# Patient Record
Sex: Female | Born: 1965 | Marital: Married | Smoking: Never smoker
Health system: Southern US, Community
[De-identification: ages and names within clinical notes are randomized; demographics above are authoritative.]

## PROBLEM LIST (undated history)

## (undated) DIAGNOSIS — J45909 Unspecified asthma, uncomplicated: Secondary | ICD-10-CM

## (undated) HISTORY — DX: Unspecified asthma, uncomplicated: J45.909

---

## 2010-03-09 ENCOUNTER — Ambulatory Visit: Payer: Self-pay | Admitting: Family Medicine

## 2011-05-31 ENCOUNTER — Ambulatory Visit: Payer: Self-pay

## 2012-06-18 ENCOUNTER — Ambulatory Visit: Payer: Self-pay | Admitting: Obstetrics and Gynecology

## 2013-06-19 ENCOUNTER — Ambulatory Visit: Payer: Self-pay | Admitting: Obstetrics and Gynecology

## 2013-11-16 DIAGNOSIS — J45909 Unspecified asthma, uncomplicated: Secondary | ICD-10-CM | POA: Insufficient documentation

## 2013-11-25 ENCOUNTER — Ambulatory Visit: Payer: Self-pay | Admitting: Physician Assistant

## 2013-11-25 LAB — URINALYSIS, COMPLETE
Bacteria: NEGATIVE
Bilirubin,UR: NEGATIVE
GLUCOSE, UR: NEGATIVE mg/dL (ref 0–75)
Ketone: NEGATIVE
Leukocyte Esterase: NEGATIVE
NITRITE: NEGATIVE
Ph: 7.5 (ref 4.5–8.0)
Protein: NEGATIVE
Specific Gravity: 1.005 (ref 1.003–1.030)

## 2013-11-25 LAB — CBC WITH DIFFERENTIAL/PLATELET
BASOS ABS: 0.1 10*3/uL (ref 0.0–0.1)
Basophil %: 0.9 %
EOS PCT: 3.9 %
Eosinophil #: 0.3 10*3/uL (ref 0.0–0.7)
HCT: 34.5 % — ABNORMAL LOW (ref 35.0–47.0)
HGB: 10.9 g/dL — AB (ref 12.0–16.0)
Lymphocyte #: 1.4 10*3/uL (ref 1.0–3.6)
Lymphocyte %: 20.5 %
MCH: 24.9 pg — ABNORMAL LOW (ref 26.0–34.0)
MCHC: 31.8 g/dL — AB (ref 32.0–36.0)
MCV: 78 fL — ABNORMAL LOW (ref 80–100)
Monocyte #: 0.5 x10 3/mm (ref 0.2–0.9)
Monocyte %: 8 %
NEUTROS ABS: 4.5 10*3/uL (ref 1.4–6.5)
Neutrophil %: 66.7 %
PLATELETS: 291 10*3/uL (ref 150–440)
RBC: 4.39 10*6/uL (ref 3.80–5.20)
RDW: 14.2 % (ref 11.5–14.5)
WBC: 6.7 10*3/uL (ref 3.6–11.0)

## 2013-11-25 LAB — BASIC METABOLIC PANEL
ANION GAP: 8 (ref 7–16)
BUN: 7 mg/dL (ref 7–18)
CHLORIDE: 101 mmol/L (ref 98–107)
Calcium, Total: 8.3 mg/dL — ABNORMAL LOW (ref 8.5–10.1)
Co2: 30 mmol/L (ref 21–32)
Creatinine: 0.79 mg/dL (ref 0.60–1.30)
GLUCOSE: 103 mg/dL — AB (ref 65–99)
OSMOLALITY: 276 (ref 275–301)
POTASSIUM: 3.7 mmol/L (ref 3.5–5.1)
Sodium: 139 mmol/L (ref 136–145)

## 2014-06-24 ENCOUNTER — Ambulatory Visit: Payer: Self-pay | Admitting: Obstetrics and Gynecology

## 2015-06-28 ENCOUNTER — Other Ambulatory Visit: Payer: Self-pay | Admitting: Obstetrics and Gynecology

## 2015-06-28 DIAGNOSIS — Z1231 Encounter for screening mammogram for malignant neoplasm of breast: Secondary | ICD-10-CM

## 2015-06-28 LAB — HM PAP SMEAR: HM Pap smear: NEGATIVE

## 2015-07-05 ENCOUNTER — Ambulatory Visit
Admission: RE | Admit: 2015-07-05 | Discharge: 2015-07-05 | Disposition: A | Discharge status: Home / Self Care | Payer: BLUE CROSS/BLUE SHIELD | Source: Ambulatory Visit | Attending: Obstetrics and Gynecology | Admitting: Obstetrics and Gynecology

## 2015-07-05 DIAGNOSIS — Z1231 Encounter for screening mammogram for malignant neoplasm of breast: Secondary | ICD-10-CM | POA: Insufficient documentation

## 2015-07-06 ENCOUNTER — Ambulatory Visit: Payer: Self-pay

## 2016-05-24 ENCOUNTER — Other Ambulatory Visit: Payer: Self-pay | Admitting: Obstetrics and Gynecology

## 2016-05-24 DIAGNOSIS — Z1231 Encounter for screening mammogram for malignant neoplasm of breast: Secondary | ICD-10-CM

## 2016-07-09 ENCOUNTER — Ambulatory Visit
Admission: RE | Admit: 2016-07-09 | Discharge: 2016-07-09 | Disposition: A | Discharge status: Home / Self Care | Payer: BLUE CROSS/BLUE SHIELD | Source: Ambulatory Visit | Attending: Obstetrics and Gynecology | Admitting: Obstetrics and Gynecology

## 2016-07-09 DIAGNOSIS — Z1231 Encounter for screening mammogram for malignant neoplasm of breast: Secondary | ICD-10-CM | POA: Diagnosis not present

## 2016-07-09 LAB — HM MAMMOGRAPHY

## 2018-02-18 ENCOUNTER — Other Ambulatory Visit: Payer: Self-pay | Admitting: Obstetrics and Gynecology

## 2018-02-18 ENCOUNTER — Ambulatory Visit
Admission: RE | Admit: 2018-02-18 | Discharge: 2018-02-18 | Disposition: A | Discharge status: Home / Self Care | Payer: BLUE CROSS/BLUE SHIELD | Source: Ambulatory Visit | Attending: Obstetrics and Gynecology | Admitting: Obstetrics and Gynecology

## 2018-02-18 DIAGNOSIS — Z1231 Encounter for screening mammogram for malignant neoplasm of breast: Secondary | ICD-10-CM

## 2018-02-19 LAB — HM MAMMOGRAPHY

## 2018-11-12 ENCOUNTER — Ambulatory Visit: Payer: Self-pay | Admitting: Internal Medicine

## 2019-01-01 ENCOUNTER — Other Ambulatory Visit: Payer: Self-pay | Admitting: Internal Medicine

## 2019-01-02 ENCOUNTER — Ambulatory Visit: Payer: BLUE CROSS/BLUE SHIELD | Admitting: Internal Medicine

## 2019-01-02 ENCOUNTER — Encounter: Payer: Self-pay | Admitting: Internal Medicine

## 2019-01-02 ENCOUNTER — Other Ambulatory Visit: Payer: Self-pay

## 2019-01-02 VITALS — BP 132/78 | HR 82 | Ht 63.0 in | Wt 192.0 lb

## 2019-01-02 DIAGNOSIS — J4520 Mild intermittent asthma, uncomplicated: Secondary | ICD-10-CM

## 2019-01-02 DIAGNOSIS — M25561 Pain in right knee: Secondary | ICD-10-CM | POA: Insufficient documentation

## 2019-01-02 DIAGNOSIS — Z23 Encounter for immunization: Secondary | ICD-10-CM | POA: Diagnosis not present

## 2019-01-02 DIAGNOSIS — M25562 Pain in left knee: Secondary | ICD-10-CM

## 2019-01-02 DIAGNOSIS — G8929 Other chronic pain: Secondary | ICD-10-CM

## 2019-01-02 NOTE — Progress Notes (Signed)
Date:  01/02/2019   Name:  Elizabeth Sutton   DOB:  Dec 31, 1965   MRN:  [id number]   Chief Complaint: Establish Care Mammogram and Pap are up to date She does not want a colonoscopy  Asthma  She complains of wheezing (when asthma flares). There is no cough or shortness of breath. This is a chronic problem. The problem occurs rarely. The problem has been unchanged. Pertinent negatives include no chest pain, fever, heartburn or weight loss. Her past medical history is significant for asthma.  Knee Pain   There was no injury mechanism. The pain is present in the left knee and right knee. The quality of the pain is described as aching. The pain is mild. The pain has been fluctuating since onset. 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 She has tried nothing for the symptoms.   She was getting inhalers from GYN who no longer feels comfortable refilling these.  Pt also thinks that she may just come here for her CPX, mammogram, etc.  Review of Systems  Constitutional: Negative for chills, fatigue, fever and weight loss.  Eyes: Negative for visual disturbance.  Respiratory: Positive for wheezing (when asthma flares). Negative for cough and shortness of breath.   Cardiovascular: Negative for chest pain, palpitations and leg swelling.  Gastrointestinal: Negative for abdominal pain, constipation, diarrhea and heartburn.  Genitourinary: Negative for difficulty urinating and menstrual problem.  Musculoskeletal: Positive for arthralgias (in both knees, limits exercise). Negative for gait problem.  Skin: Negative for rash.  Allergic/Immunologic: Negative for environmental allergies.  Hematological: Negative for adenopathy.  Psychiatric/Behavioral: Negative for dysphoric mood and sleep disturbance. The patient is not nervous/anxious.     Patient Active Problem List   Diagnosis Date Noted  . Asthma 11/16/2013    No  Known Allergies  History reviewed. No pertinent surgical history.  Social History   Tobacco Use  . Smoking status: Never Smoker  . Smokeless tobacco: Never Used  Substance Use Topics  . Alcohol use: Never    Frequency: Never  . Drug use: Never     Medication list has been reviewed and updated.  Current Meds  Medication Sig  . albuterol (PROVENTIL HFA) 108 (90 Base) MCG/ACT inhaler Inhale 2 puffs into the lungs every 4 (four) hours as needed.   . budesonide-formoterol (SYMBICORT) 80-4.5 MCG/ACT inhaler Inhale 2 puffs into the lungs 2 (two) times a day.   . ferrous sulfate 325 (65 FE) MG tablet Take by mouth.    PHQ 2/9 Scores 01/02/2019  PHQ - 2 Score 1    BP Readings from Last 3 Encounters:  01/02/19 132/78    Physical Exam Vitals signs and nursing note reviewed.  Constitutional:      General: She is not in acute distress.    Appearance: Normal appearance. She is well-developed.  HENT:     Head: Normocephalic and atraumatic.  Eyes:     Pupils: Pupils are equal, round, and reactive to light.  Neck:     Musculoskeletal: Normal range of motion and neck supple.  Cardiovascular:     Rate and Rhythm: Normal rate and regular rhythm.     Pulses: Normal pulses.     Heart sounds: No murmur.  Pulmonary:     Effort: Pulmonary effort is normal. No respiratory distress.     Breath sounds: No stridor. No wheezing or rhonchi.  Musculoskeletal: Normal range of motion.  Right knee: She exhibits normal range of motion, no swelling and no effusion.     Left knee: She exhibits no swelling, no effusion and no ecchymosis.     Right lower leg: No edema.     Left lower leg: No edema.  Lymphadenopathy:     Cervical: No cervical adenopathy.  Skin:    General: Skin is warm and dry.     Capillary Refill: Capillary refill takes less than 2 seconds.     Findings: No rash.  Neurological:     Mental Status: She is alert and oriented to person, place, and time.  Psychiatric:         Behavior: Behavior normal.        Thought Content: Thought content normal.     Wt Readings from Last 3 Encounters:  01/02/19 192 lb (87.1 kg)    BP 132/78   Pulse 82   Ht 5\' 3"  (1.6 m)   Wt 192 lb (87.1 kg)   LMP 06/25/2016   SpO2 100%   BMI 34.01 kg/m   Assessment and Plan: 1. Mild intermittent asthma without complication Controlled, continue current therapy -  2. Need for vaccination for pneumococcus Given today - Pneumococcal polysaccharide vaccine 23-valent greater than or equal to 2yo subcutaneous/IM  3. Chronic pain of both knees Likely  OA Recommend gentle exercise, tylenol, shoe inserts   Partially dictated using Animal nutritionist. Any errors are unintentional.  Bari Edward, MD [facility]  01/02/2019

## 2019-01-02 NOTE — Patient Instructions (Signed)

## 2019-06-01 ENCOUNTER — Ambulatory Visit (INDEPENDENT_AMBULATORY_CARE_PROVIDER_SITE_OTHER): Payer: BLUE CROSS/BLUE SHIELD | Admitting: Internal Medicine

## 2019-06-01 ENCOUNTER — Encounter: Payer: Self-pay | Admitting: Internal Medicine

## 2019-06-01 ENCOUNTER — Other Ambulatory Visit: Payer: Self-pay

## 2019-06-01 VITALS — BP 128/72 | HR 61 | Temp 96.9°F | Ht 63.0 in | Wt 193.0 lb

## 2019-06-01 DIAGNOSIS — Z0000 Encounter for general adult medical examination without abnormal findings: Secondary | ICD-10-CM | POA: Diagnosis not present

## 2019-06-01 DIAGNOSIS — Z23 Encounter for immunization: Secondary | ICD-10-CM

## 2019-06-01 DIAGNOSIS — Z1211 Encounter for screening for malignant neoplasm of colon: Secondary | ICD-10-CM | POA: Diagnosis not present

## 2019-06-01 DIAGNOSIS — M25562 Pain in left knee: Secondary | ICD-10-CM

## 2019-06-01 DIAGNOSIS — M25561 Pain in right knee: Secondary | ICD-10-CM

## 2019-06-01 DIAGNOSIS — G8929 Other chronic pain: Secondary | ICD-10-CM

## 2019-06-01 DIAGNOSIS — Z1231 Encounter for screening mammogram for malignant neoplasm of breast: Secondary | ICD-10-CM | POA: Diagnosis not present

## 2019-06-01 DIAGNOSIS — J4520 Mild intermittent asthma, uncomplicated: Secondary | ICD-10-CM

## 2019-06-01 LAB — POCT URINALYSIS DIPSTICK
Bilirubin, UA: NEGATIVE
Blood, UA: NEGATIVE
Glucose, UA: NEGATIVE
Ketones, UA: NEGATIVE
Leukocytes, UA: NEGATIVE
Nitrite, UA: NEGATIVE
Protein, UA: NEGATIVE
Spec Grav, UA: 1.01 (ref 1.010–1.025)
Urobilinogen, UA: 0.2 E.U./dL
pH, UA: 6 (ref 5.0–8.0)

## 2019-06-01 MED ORDER — BUDESONIDE-FORMOTEROL FUMARATE 80-4.5 MCG/ACT IN AERO: 2.0000 | INHALATION_SPRAY | Freq: Two times a day (BID) | RESPIRATORY_TRACT | 12 refills | Status: DC

## 2019-06-01 NOTE — Progress Notes (Signed)
Date:  06/01/2019   Name:  Elizabeth Sutton   DOB:  01/04/1966   MRN:  [id number]   Chief Complaint: Annual Exam (Breast Exam. Flu shot?) Elizabeth Sutton is a 53 y.o. female who presents today for her Complete Annual Exam. She feels well. She reports exercising rarely. She reports she is sleeping well.   Mammogram  02/2018 Colonoscopy - previously declined Pap smear 2016  Asthma There is no cough, shortness of breath or wheezing. This is a chronic problem. The problem occurs rarely. The problem has been resolved. Pertinent negatives include no chest pain, fever, headaches or trouble swallowing. Her symptoms are alleviated by steroid inhaler and beta-agonist. She reports complete improvement on treatment. Her past medical history is significant for asthma.  Knee Pain  There was no injury mechanism. The pain is present in the left knee and right knee. The quality of the pain is described as aching. The pain is moderate. Associated symptoms include an inability to bear weight. Pertinent negatives include no loss of motion, numbness or tingling. The symptoms are aggravated by weight bearing. She has tried acetaminophen for the symptoms. The treatment provided mild relief.    Review of Systems  Constitutional: Negative for chills, fatigue and fever.  HENT: Negative for congestion, hearing loss, tinnitus, trouble swallowing and voice change.   Eyes: Negative for visual disturbance.  Respiratory: Negative for cough, chest tightness, shortness of breath and wheezing.   Cardiovascular: Negative for chest pain, palpitations and leg swelling.  Gastrointestinal: Negative for abdominal pain, constipation, diarrhea and vomiting.       Occasional heartburn  Endocrine: Negative for polydipsia and polyuria.  Genitourinary: Negative for dysuria, frequency, genital sores, vaginal bleeding and vaginal discharge.  Musculoskeletal: Positive for arthralgias (knees and shoulders). Negative for gait  problem and joint swelling.  Skin: Negative for color change and rash.  Neurological: Negative for dizziness, tingling, tremors, light-headedness, numbness and headaches.  Hematological: Negative for adenopathy. Does not bruise/bleed easily.  Psychiatric/Behavioral: Negative for dysphoric mood and sleep disturbance. The patient is not nervous/anxious.     Patient Active Problem List   Diagnosis Date Noted  . Knee pain, bilateral 01/02/2019  . Asthma 11/16/2013    No Known Allergies  History reviewed. No pertinent surgical history.  Social History   Tobacco Use  . Smoking status: Never Smoker  . Smokeless tobacco: Never Used  Substance Use Topics  . Alcohol use: Never    Frequency: Never  . Drug use: Never     Medication list has been reviewed and updated.  Current Meds  Medication Sig  . albuterol (PROVENTIL HFA) 108 (90 Base) MCG/ACT inhaler Inhale 2 puffs into the lungs every 4 (four) hours as needed.   . budesonide-formoterol (SYMBICORT) 80-4.5 MCG/ACT inhaler Inhale 2 puffs into the lungs 2 (two) times a day.   . ferrous sulfate 325 (65 FE) MG tablet Take by mouth.    PHQ 2/9 Scores 06/01/2019 01/02/2019  PHQ - 2 Score 0 1    BP Readings from Last 3 Encounters:  06/01/19 128/72  01/02/19 132/78    Physical Exam Vitals signs and nursing note reviewed.  Constitutional:      General: She is not in acute distress.    Appearance: She is well-developed.  HENT:     Head: Normocephalic and atraumatic.     Right Ear: Tympanic membrane and ear canal normal.     Left Ear: Tympanic membrane and ear canal normal.  Nose:     Right Sinus: No maxillary sinus tenderness.     Left Sinus: No maxillary sinus tenderness.  Eyes:     General: No scleral icterus.       Right eye: No discharge.        Left eye: No discharge.     Conjunctiva/sclera: Conjunctivae normal.  Neck:     Musculoskeletal: Normal range of motion. No erythema.     Thyroid: No thyromegaly.      Vascular: No carotid bruit.  Cardiovascular:     Rate and Rhythm: Normal rate and regular rhythm.     Pulses: Normal pulses.     Heart sounds: Normal heart sounds.  Pulmonary:     Effort: Pulmonary effort is normal. No respiratory distress.     Breath sounds: No wheezing.  Chest:     Breasts:        Right: No mass, nipple discharge, skin change or tenderness.        Left: No mass, nipple discharge, skin change or tenderness.  Abdominal:     General: Bowel sounds are normal.     Palpations: Abdomen is soft.     Tenderness: There is no abdominal tenderness.  Musculoskeletal: Normal range of motion.     Right knee: She exhibits normal range of motion, no swelling and no effusion.     Left knee: She exhibits normal range of motion, no swelling and no effusion.  Lymphadenopathy:     Cervical: No cervical adenopathy.  Skin:    General: Skin is warm and dry.     Findings: No rash.  Neurological:     Mental Status: She is alert and oriented to person, place, and time.     Cranial Nerves: No cranial nerve deficit.     Sensory: No sensory deficit.     Deep Tendon Reflexes: Reflexes are normal and symmetric.  Psychiatric:        Speech: Speech normal.        Behavior: Behavior normal.        Thought Content: Thought content normal.     Wt Readings from Last 3 Encounters:  06/01/19 193 lb (87.5 kg)  01/02/19 192 lb (87.1 kg)    BP 128/72   Pulse 61   Temp (!) 96.9 F (36.1 C) (Temporal)   Ht 5\' 3"  (1.6 m)   Wt 193 lb (87.5 kg)   LMP 06/25/2016   SpO2 98%   BMI 34.19 kg/m   Assessment and Plan: 1. Annual physical exam Normal exam except for weight Encourage more exercise if able - CBC with Differential/Platelet - Comprehensive metabolic panel - Lipid panel - TSH - POCT urinalysis dipstick  2. Encounter for screening mammogram for breast cancer To be scheduled at [facility] - MM 3D SCREEN BREAST BILATERAL; Future  3. Colon cancer screening - Fecal occult blood,  imunochemical  4. Mild intermittent asthma without complication Clinically stable with no recurrent asthma symptoms No change in regimen due to stability - budesonide-formoterol (SYMBICORT) 80-4.5 MCG/ACT inhaler; Inhale 2 puffs into the lungs 2 (two) times daily.  Dispense: 1 Inhaler; Refill: 12  5. Chronic pain of both knees Like OA since long standing and gradually worsening Continue tylenol for now Refer to Orthopedics - Ambulatory referral to Orthopedic Surgery   Partially dictated using Dragon software. Any errors are unintentional.  [facility], MD [facility]  06/01/2019

## 2019-06-02 LAB — COMPREHENSIVE METABOLIC PANEL
ALT: 30 IU/L (ref 0–32)
AST: 22 IU/L (ref 0–40)
Albumin/Globulin Ratio: 1.2 (ref 1.2–2.2)
Albumin: 4 g/dL (ref 3.8–4.9)
Alkaline Phosphatase: 112 IU/L (ref 39–117)
BUN/Creatinine Ratio: 11 (ref 9–23)
BUN: 8 mg/dL (ref 6–24)
Bilirubin Total: 0.2 mg/dL (ref 0.0–1.2)
CO2: 27 mmol/L (ref 20–29)
Calcium: 8.9 mg/dL (ref 8.7–10.2)
Chloride: 101 mmol/L (ref 96–106)
Creatinine, Ser: 0.75 mg/dL (ref 0.57–1.00)
GFR calc Af Amer: 105 mL/min/{1.73_m2} (ref >59)
GFR calc non Af Amer: 91 mL/min/{1.73_m2} (ref >59)
Globulin, Total: 3.4 g/dL (ref 1.5–4.5)
Glucose: 114 mg/dL — ABNORMAL HIGH (ref 65–99)
Potassium: 4.8 mmol/L (ref 3.5–5.2)
Sodium: 139 mmol/L (ref 134–144)
Total Protein: 7.4 g/dL (ref 6.0–8.5)

## 2019-06-02 LAB — CBC WITH DIFFERENTIAL/PLATELET
Basophils Absolute: 0.1 10*3/uL (ref 0.0–0.2)
Basos: 1 %
EOS (ABSOLUTE): 0.2 10*3/uL (ref 0.0–0.4)
Eos: 2 %
Hematocrit: 36.6 % (ref 34.0–46.6)
Hemoglobin: 11.7 g/dL (ref 11.1–15.9)
Immature Grans (Abs): 0 10*3/uL (ref 0.0–0.1)
Immature Granulocytes: 0 %
Lymphocytes Absolute: 2 10*3/uL (ref 0.7–3.1)
Lymphs: 24 %
MCH: 24.5 pg — ABNORMAL LOW (ref 26.6–33.0)
MCHC: 32 g/dL (ref 31.5–35.7)
MCV: 77 fL — ABNORMAL LOW (ref 79–97)
Monocytes Absolute: 0.6 10*3/uL (ref 0.1–0.9)
Monocytes: 7 %
Neutrophils Absolute: 5.5 10*3/uL (ref 1.4–7.0)
Neutrophils: 66 %
Platelets: 288 10*3/uL (ref 150–450)
RBC: 4.77 x10E6/uL (ref 3.77–5.28)
RDW: 14.4 % (ref 11.7–15.4)
WBC: 8.4 10*3/uL (ref 3.4–10.8)

## 2019-06-02 LAB — LIPID PANEL
Chol/HDL Ratio: 3.8 ratio (ref 0.0–4.4)
Cholesterol, Total: 205 mg/dL — ABNORMAL HIGH (ref 100–199)
HDL: 54 mg/dL (ref >39)
LDL Chol Calc (NIH): 123 mg/dL — ABNORMAL HIGH (ref 0–99)
Triglycerides: 158 mg/dL — ABNORMAL HIGH (ref 0–149)
VLDL Cholesterol Cal: 28 mg/dL (ref 5–40)

## 2019-06-02 LAB — TSH: TSH: 3.4 u[IU]/mL (ref 0.450–4.500)

## 2019-06-06 LAB — HGB A1C W/O EAG

## 2019-06-06 LAB — SPECIMEN STATUS REPORT

## 2019-07-14 IMAGING — MG MM DIGITAL SCREENING BILAT W/ TOMO W/ CAD
8 series · 8 of 24 positions shown · non-contrast
Comparison: Previous exam(s).

CLINICAL DATA: Screening.

EXAM:
DIGITAL SCREENING BILATERAL MAMMOGRAM WITH TOMO AND CAD

[L CC synth-2D]
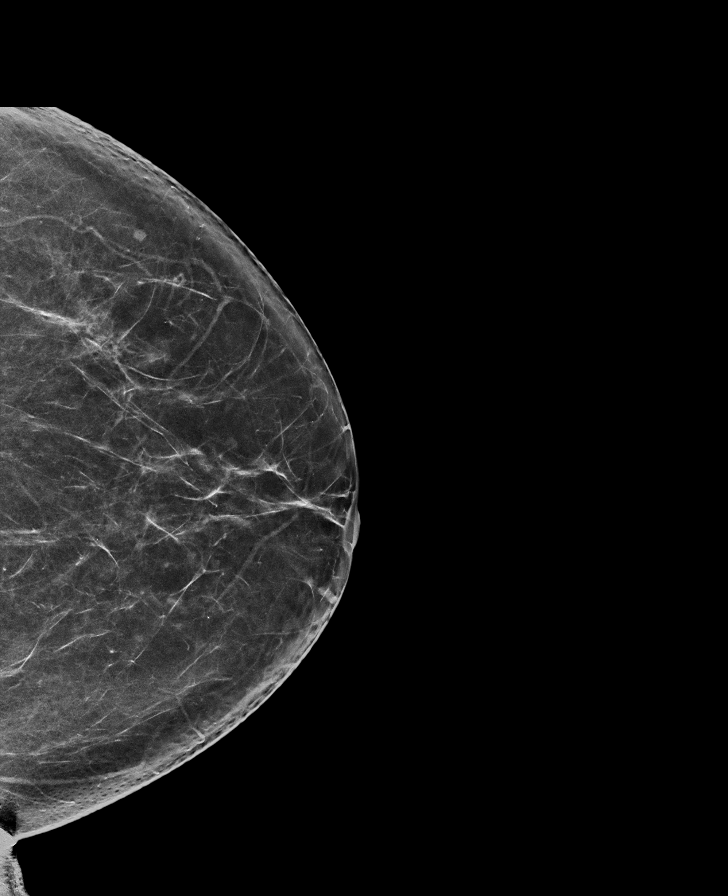

[R CC synth-2D]
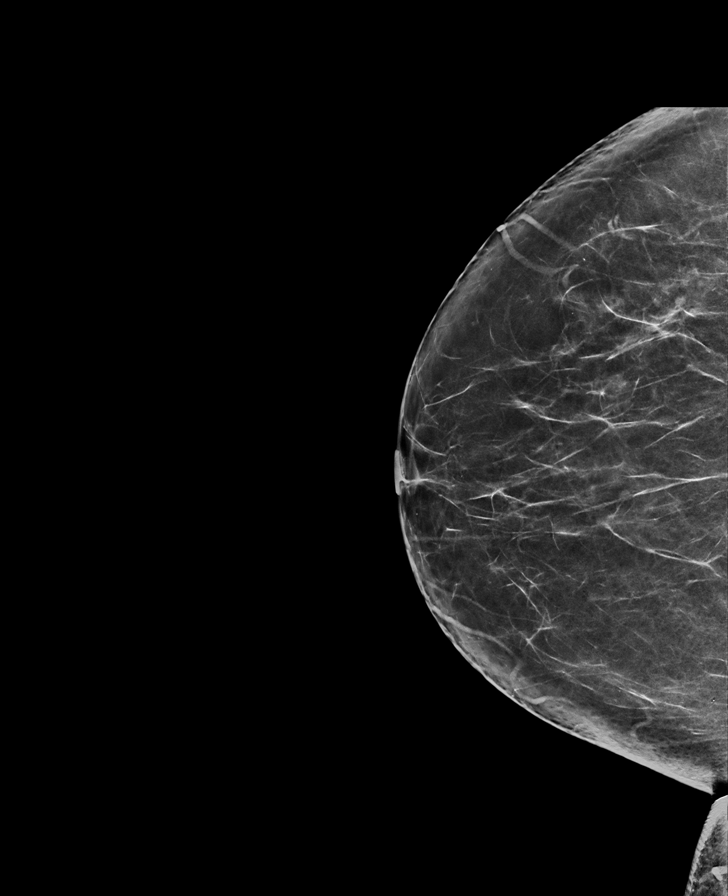

[R MLO synth-2D]
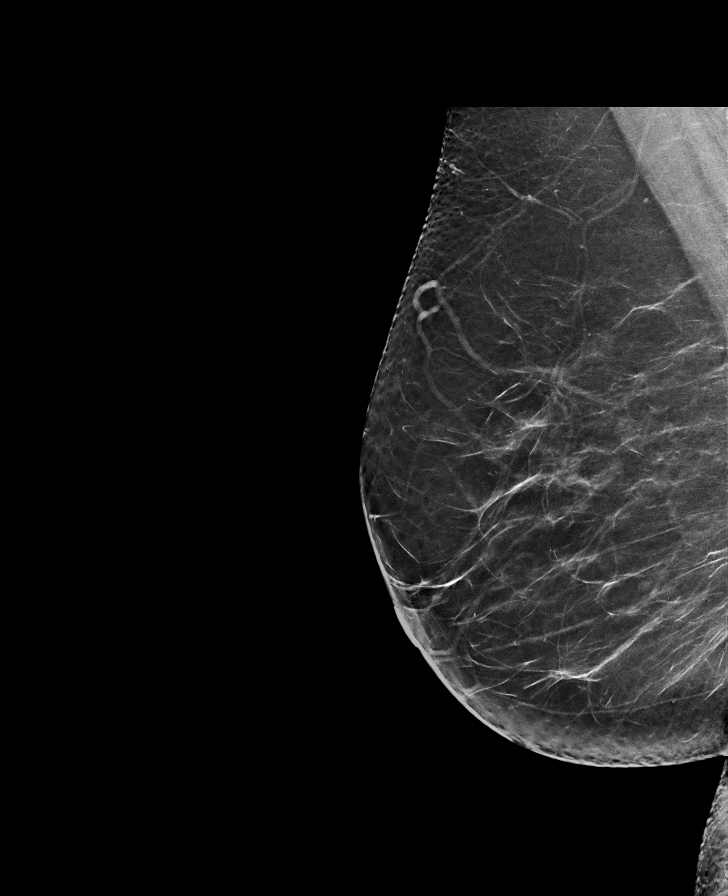

[L MLO synth-2D]
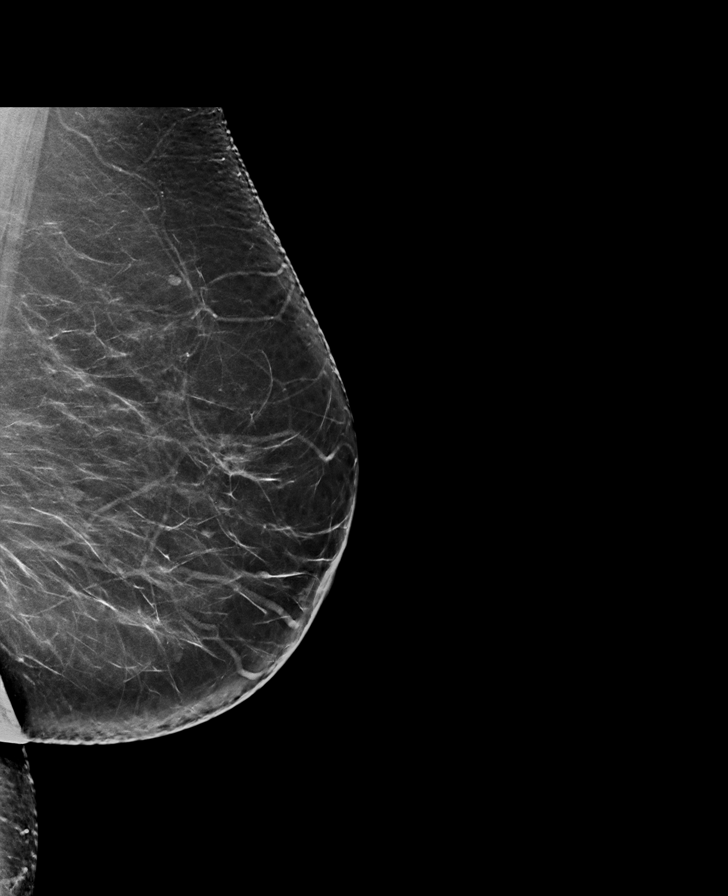

[R CC tomo · tomo slice 38/75.0]
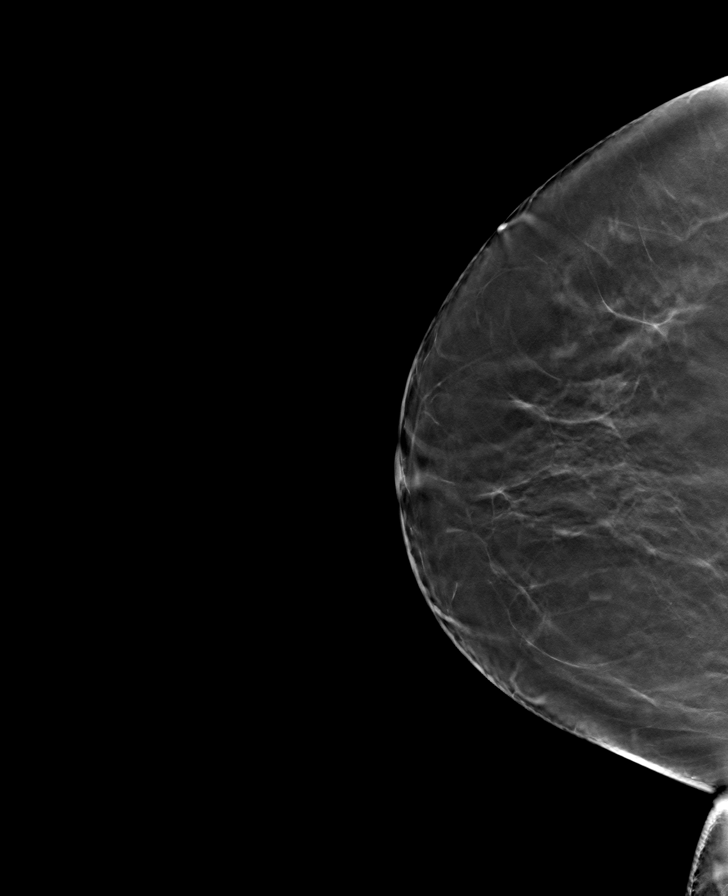

[L MLO tomo · tomo slice 43/85.0]
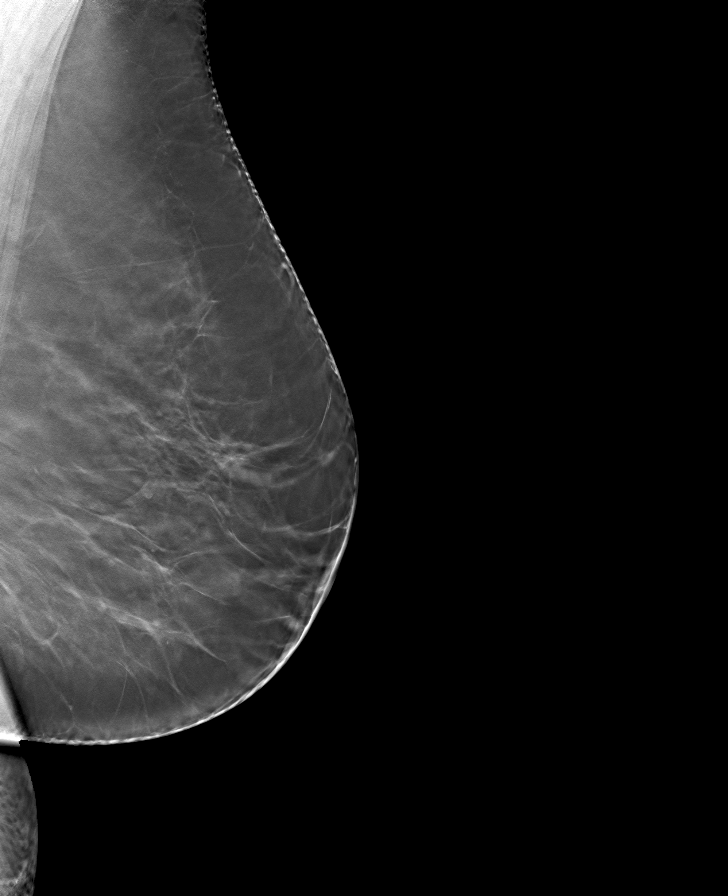

[L CC tomo · tomo slice 38/75.0]
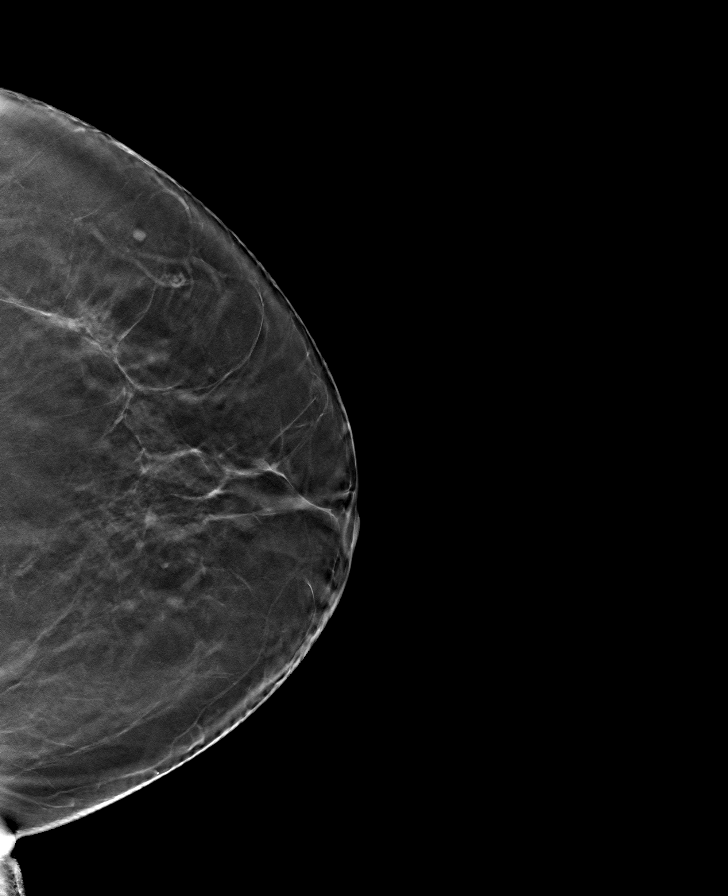

[R MLO tomo · tomo slice 41/80.0]
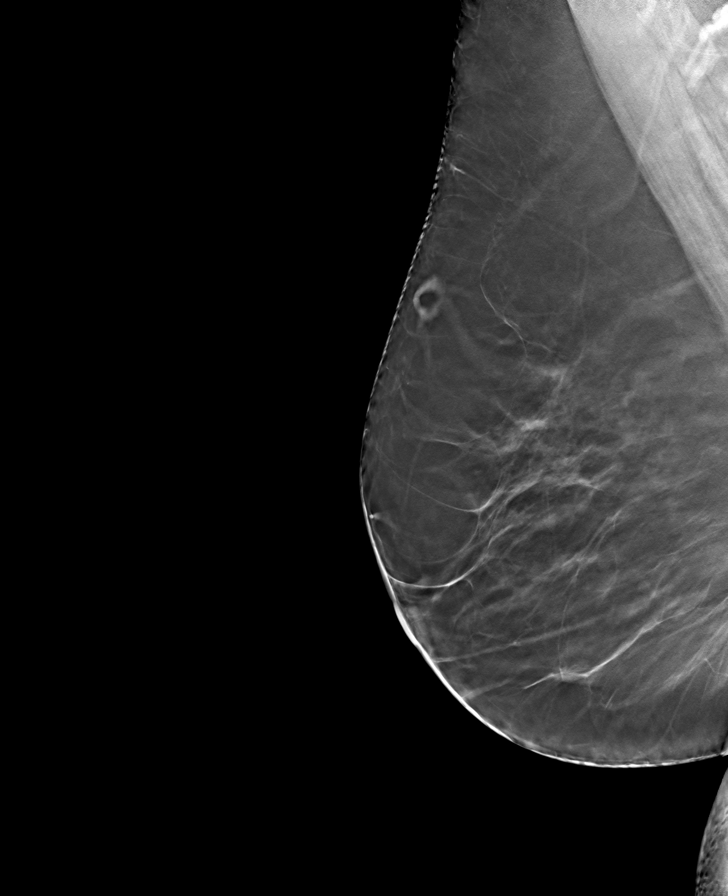

[8 of 24 positions shown; findings below may reference images not displayed]

ACR Breast Density Category b: There are scattered areas of
fibroglandular density.
FINDINGS: There are no findings suspicious for malignancy. Images were
processed with CAD.
IMPRESSION: No mammographic evidence of malignancy. A result letter of this
screening mammogram will be mailed directly to the patient.

RECOMMENDATION:
Screening mammogram in one year. (Code:[id number])

BI-RADS CATEGORY  1: Negative.

## 2019-09-09 ENCOUNTER — Ambulatory Visit: Payer: BLUE CROSS/BLUE SHIELD

## 2019-09-14 ENCOUNTER — Ambulatory Visit
Admission: RE | Admit: 2019-09-14 | Discharge: 2019-09-14 | Disposition: A | Discharge status: Home / Self Care | Payer: BLUE CROSS/BLUE SHIELD | Source: Ambulatory Visit | Attending: Internal Medicine | Admitting: Internal Medicine

## 2019-09-14 ENCOUNTER — Other Ambulatory Visit: Payer: Self-pay

## 2019-09-14 ENCOUNTER — Encounter (INDEPENDENT_AMBULATORY_CARE_PROVIDER_SITE_OTHER): Payer: Self-pay

## 2019-09-14 DIAGNOSIS — Z1231 Encounter for screening mammogram for malignant neoplasm of breast: Secondary | ICD-10-CM | POA: Insufficient documentation

## 2020-06-01 ENCOUNTER — Encounter: Payer: BLUE CROSS/BLUE SHIELD | Admitting: Internal Medicine

## 2020-06-24 ENCOUNTER — Other Ambulatory Visit: Payer: Self-pay | Admitting: Internal Medicine

## 2020-06-24 DIAGNOSIS — J4520 Mild intermittent asthma, uncomplicated: Secondary | ICD-10-CM

## 2020-06-24 NOTE — Telephone Encounter (Signed)
Attempted to call patient to schedule appointment- left message to call office for appointment- courtesy RF given #1

## 2020-07-22 ENCOUNTER — Other Ambulatory Visit: Payer: Self-pay | Admitting: Internal Medicine

## 2020-07-22 ENCOUNTER — Other Ambulatory Visit: Payer: Self-pay

## 2020-07-22 DIAGNOSIS — J4520 Mild intermittent asthma, uncomplicated: Secondary | ICD-10-CM

## 2020-07-22 MED ORDER — BUDESONIDE-FORMOTEROL FUMARATE 80-4.5 MCG/ACT IN AERO: INHALATION_SPRAY | RESPIRATORY_TRACT | 0 refills | Status: AC

## 2020-07-22 NOTE — Telephone Encounter (Signed)
Pt has out of [employer], asking for a refill until she can find a new pcp? Please advise

## 2020-07-22 NOTE — Telephone Encounter (Signed)
Called pt let her know that RF was sent in. Explain to pt that we can not RF this medication anymore. Pt is finding a new PCP. Pt verbalized understanding.  KP

## 2020-07-22 NOTE — Telephone Encounter (Signed)
Please call pt and schedule appt for asthma f/u.  KP

## 2020-07-22 NOTE — Telephone Encounter (Signed)
Requested medications are due for refill today yes  Requested medications are on the active medication list yes  Last refill 11/19  Last visit 06/2019  Future visit scheduled no  Notes to clinic Failed protocol due to no valid visit within 12  months. Also no PCP is listed, however, Dr. Judithann Graves wrote last rx.

## 2020-08-19 ENCOUNTER — Other Ambulatory Visit: Payer: Self-pay | Admitting: Internal Medicine

## 2020-08-19 DIAGNOSIS — J4520 Mild intermittent asthma, uncomplicated: Secondary | ICD-10-CM

## 2020-08-19 NOTE — Telephone Encounter (Signed)
   Notes to clinic Last rx says she will get a PCP, please assess.
